# Patient Record
Sex: Male | Born: 1985 | Race: White | Hispanic: No | Marital: Married | Smoking: Never smoker
Health system: Southern US, Community
[De-identification: ages and names within clinical notes are randomized; demographics above are authoritative.]

## PROBLEM LIST (undated history)

## (undated) HISTORY — PX: ANTERIOR CRUCIATE LIGAMENT REPAIR: SHX115

---

## 2019-08-08 ENCOUNTER — Other Ambulatory Visit: Payer: Self-pay

## 2019-08-08 DIAGNOSIS — Z20822 Contact with and (suspected) exposure to covid-19: Secondary | ICD-10-CM

## 2019-08-09 LAB — NOVEL CORONAVIRUS, NAA: SARS-CoV-2, NAA: NOT DETECTED

## 2020-05-13 ENCOUNTER — Other Ambulatory Visit: Payer: Self-pay

## 2020-05-13 ENCOUNTER — Encounter (HOSPITAL_COMMUNITY): Payer: Self-pay | Admitting: Emergency Medicine

## 2020-05-13 ENCOUNTER — Emergency Department (HOSPITAL_COMMUNITY): Payer: BC Managed Care – PPO

## 2020-05-13 DIAGNOSIS — R109 Unspecified abdominal pain: Secondary | ICD-10-CM | POA: Diagnosis present

## 2020-05-13 DIAGNOSIS — R319 Hematuria, unspecified: Secondary | ICD-10-CM | POA: Insufficient documentation

## 2020-05-13 DIAGNOSIS — M549 Dorsalgia, unspecified: Secondary | ICD-10-CM | POA: Diagnosis not present

## 2020-05-13 DIAGNOSIS — N201 Calculus of ureter: Secondary | ICD-10-CM | POA: Diagnosis not present

## 2020-05-13 LAB — CBC
HCT: 43.4 % (ref 39.0–52.0)
Hemoglobin: 14.2 g/dL (ref 13.0–17.0)
MCH: 28.9 pg (ref 26.0–34.0)
MCHC: 32.7 g/dL (ref 30.0–36.0)
MCV: 88.2 fL (ref 80.0–100.0)
Platelets: 300 10*3/uL (ref 150–400)
RBC: 4.92 MIL/uL (ref 4.22–5.81)
RDW: 11.9 % (ref 11.5–15.5)
WBC: 11.1 10*3/uL — ABNORMAL HIGH (ref 4.0–10.5)
nRBC: 0 % (ref 0.0–0.2)

## 2020-05-13 LAB — COMPREHENSIVE METABOLIC PANEL
ALT: 21 U/L (ref 0–44)
AST: 23 U/L (ref 15–41)
Albumin: 4.7 g/dL (ref 3.5–5.0)
Alkaline Phosphatase: 58 U/L (ref 38–126)
Anion gap: 14 (ref 5–15)
BUN: 17 mg/dL (ref 6–20)
CO2: 25 mmol/L (ref 22–32)
Calcium: 9.9 mg/dL (ref 8.9–10.3)
Chloride: 103 mmol/L (ref 98–111)
Creatinine, Ser: 1.27 mg/dL — ABNORMAL HIGH (ref 0.61–1.24)
GFR calc Af Amer: >60 mL/min (ref >60)
GFR calc non Af Amer: >60 mL/min (ref >60)
Glucose, Bld: 132 mg/dL — ABNORMAL HIGH (ref 70–99)
Potassium: 3.8 mmol/L (ref 3.5–5.1)
Sodium: 142 mmol/L (ref 135–145)
Total Bilirubin: 0.4 mg/dL (ref 0.3–1.2)
Total Protein: 8.1 g/dL (ref 6.5–8.1)

## 2020-05-13 LAB — LIPASE, BLOOD: Lipase: 40 U/L (ref 11–51)

## 2020-05-13 MED ORDER — ONDANSETRON 4 MG PO TBDP: 4.0000 mg | ORAL_TABLET | Freq: Once | ORAL | Status: DC | PRN

## 2020-05-13 NOTE — ED Triage Notes (Signed)
Patient complaining of left back pain radiating to left lower abdominal. Patient states the pain is making him nauseas. This started 2030 tonight.

## 2020-05-14 ENCOUNTER — Emergency Department (HOSPITAL_COMMUNITY)
Admission: EM | Admit: 2020-05-14 | Discharge: 2020-05-14 | Disposition: A | Discharge status: Home / Self Care | Payer: BC Managed Care – PPO | Attending: Emergency Medicine | Admitting: Emergency Medicine

## 2020-05-14 DIAGNOSIS — N201 Calculus of ureter: Secondary | ICD-10-CM

## 2020-05-14 DIAGNOSIS — R319 Hematuria, unspecified: Secondary | ICD-10-CM

## 2020-05-14 LAB — URINALYSIS, ROUTINE W REFLEX MICROSCOPIC
Bacteria, UA: NONE SEEN
Bilirubin Urine: NEGATIVE
Glucose, UA: NEGATIVE mg/dL
Ketones, ur: NEGATIVE mg/dL
Leukocytes,Ua: NEGATIVE
Nitrite: NEGATIVE
Protein, ur: NEGATIVE mg/dL
Specific Gravity, Urine: 1.025 (ref 1.005–1.030)
pH: 5 (ref 5.0–8.0)

## 2020-05-14 MED ORDER — OXYCODONE-ACETAMINOPHEN 5-325 MG PO TABS
1.0000 | ORAL_TABLET | Freq: Once | ORAL | Status: AC
  Administered 2020-05-14: 1 via ORAL
  Filled 2020-05-14: qty 1

## 2020-05-14 MED ORDER — OXYCODONE-ACETAMINOPHEN 5-325 MG PO TABS: 1.0000 | ORAL_TABLET | ORAL | 0 refills | Status: DC | PRN

## 2020-05-14 MED ORDER — ONDANSETRON 4 MG PO TBDP
4.0000 mg | ORAL_TABLET | Freq: Once | ORAL | Status: AC
  Administered 2020-05-14: 4 mg via ORAL
  Filled 2020-05-14: qty 1

## 2020-05-14 MED ORDER — KETOROLAC TROMETHAMINE 60 MG/2ML IM SOLN
30.0000 mg | Freq: Once | INTRAMUSCULAR | Status: AC
  Administered 2020-05-14: 30 mg via INTRAMUSCULAR
  Filled 2020-05-14: qty 2

## 2020-05-14 MED ORDER — TAMSULOSIN HCL 0.4 MG PO CAPS
0.4000 mg | ORAL_CAPSULE | Freq: Every day | ORAL | 0 refills | Status: DC
Start: 2020-05-14 — End: 2022-01-03

## 2020-05-14 MED ORDER — ONDANSETRON 4 MG PO TBDP
4.0000 mg | ORAL_TABLET | Freq: Three times a day (TID) | ORAL | 0 refills | Status: DC | PRN
Start: 2020-05-14 — End: 2022-01-03

## 2020-05-14 NOTE — Discharge Instructions (Signed)
You were found to have kidney stone on your left side today.  This is very close to your bladder and will likely pass in the next 24 to 48 hours. Take the prescribed medication as directed.  Do not drive while taking pain medication. Follow-up with urology--call their office for appointment. Return to the ED for new or worsening symptoms--high fever, inability to urinate, uncontrolled vomiting, etc.

## 2020-05-14 NOTE — ED Provider Notes (Signed)
[facility]-EMERGENCY DEPT Provider Note   CSN: [id number] Arrival date & time: 05/13/20  2054     History Chief Complaint  Patient presents with  . Flank Pain  . Back Pain    Colin Holmes is a 34 y.o. male.  The history is provided by medical records and the patient.  Flank Pain  Back Pain   33 y.o. M presenting to the ED with sudden onset left flank pain around 8:30PM.  States pain is sharp/stabbing and coming in waves, now more of a dull ache.  Pain does improve when he puts his fist behind his back and applies pressure.  Did have some associated nausea and vomiting initially when pain was most intense, that seems to have improved.  No fever or chills.  No difficulty urinating or noted hematuria.  No history of kidney stones.  No meds prior to arrival.  History reviewed. No pertinent past medical history.  There are no problems to display for this patient.   History reviewed. No pertinent surgical history.     History reviewed. No pertinent family history.  Social History   Tobacco Use  . Smoking status: Never Smoker  . Smokeless tobacco: Never Used  Vaping Use  . Vaping Use: Never used  Substance Use Topics  . Alcohol use: Yes  . Drug use: Yes    Types: Marijuana    Home Medications Prior to Admission medications   Not on File    Allergies    Patient has no known allergies.  Review of Systems   Review of Systems  Genitourinary: Positive for flank pain.  Musculoskeletal: Positive for back pain.  All other systems reviewed and are negative.   Physical Exam Updated Vital Signs BP 138/71   Pulse 80   Temp 97.8 F (36.6 C) (Oral)   Resp 18   Ht 5\' 8"  (1.727 m)   Wt 93 kg   SpO2 98%   BMI 31.17 kg/m   Physical Exam Vitals and nursing note reviewed.  Constitutional:      Appearance: He is well-developed.  HENT:     Head: Normocephalic and atraumatic.  Eyes:     Conjunctiva/sclera: Conjunctivae normal.     Pupils:  Pupils are equal, round, and reactive to light.  Cardiovascular:     Rate and Rhythm: Normal rate and regular rhythm.     Heart sounds: Normal heart sounds.  Pulmonary:     Effort: Pulmonary effort is normal.     Breath sounds: Normal breath sounds.  Abdominal:     General: Bowel sounds are normal.     Palpations: Abdomen is soft.  Musculoskeletal:        General: Normal range of motion.     Cervical back: Normal range of motion.  Skin:    General: Skin is warm and dry.  Neurological:     Mental Status: He is alert and oriented to person, place, and time.     ED Results / Procedures / Treatments   Labs (all labs ordered are listed, but only abnormal results are displayed) Labs Reviewed  COMPREHENSIVE METABOLIC PANEL - Abnormal; Notable for the following components:      Result Value   Glucose, Bld 132 (*)    Creatinine, Ser 1.27 (*)    All other components within normal limits  CBC - Abnormal; Notable for the following components:   WBC 11.1 (*)    All other components within normal limits  URINALYSIS, ROUTINE W REFLEX  MICROSCOPIC - Abnormal; Notable for the following components:   Hgb urine dipstick SMALL (*)    All other components within normal limits  LIPASE, BLOOD    EKG None  Radiology CT Renal Stone Study  Result Date: 05/13/2020 CLINICAL DATA:  Flank pain EXAM: CT ABDOMEN AND PELVIS WITHOUT CONTRAST TECHNIQUE: Multidetector CT imaging of the abdomen and pelvis was performed following the standard protocol without IV contrast. COMPARISON:  None. FINDINGS: Lower chest: No acute consolidation or pleural effusion. Normal cardiac size. Hepatobiliary: No focal liver abnormality is seen. No gallstones, gallbladder wall thickening, or biliary dilatation. Pancreas: Unremarkable. No pancreatic ductal dilatation or surrounding inflammatory changes. Spleen: Normal in size without focal abnormality. Adrenals/Urinary Tract: Adrenal glands are normal. Punctate stone in the mid  right kidney. Multiple punctate stones within the left kidney. Mild left hydronephrosis and hydroureter, secondary to a 5 mm stone in the distal ureter just proximal to the left UVJ. The bladder is unremarkable. Stomach/Bowel: Stomach is within normal limits. Appendix appears normal. No evidence of bowel wall thickening, distention, or inflammatory changes. Vascular/Lymphatic: No significant vascular findings are present. No enlarged abdominal or pelvic lymph nodes. Reproductive: Prostate calcification without mass Other: Negative for free air or free fluid. Tiny fat in the umbilical region. Musculoskeletal: No acute or significant osseous findings. IMPRESSION: 1. Mild left hydronephrosis and hydroureter, secondary to a 5 mm stone in the distal left ureter just proximal to the left UVJ. 2. Multiple intrarenal stones bilaterally. Electronically Signed   By: Jasmine Pang M.D.   On: 05/13/2020 22:23    Procedures Procedures (including critical care time)  Medications Ordered in ED Medications  ondansetron (ZOFRAN-ODT) disintegrating tablet 4 mg (has no administration in time range)  ketorolac (TORADOL) injection 30 mg (30 mg Intramuscular Given 05/14/20 0510)  oxyCODONE-acetaminophen (PERCOCET/ROXICET) 5-325 MG per tablet 1 tablet (1 tablet Oral Given 05/14/20 0509)  ondansetron (ZOFRAN-ODT) disintegrating tablet 4 mg (4 mg Oral Given 05/14/20 1607)    ED Course  I have reviewed the triage vital signs and the nursing notes.  Pertinent labs & imaging results that were available during my care of the patient were reviewed by me and considered in my medical decision making (see chart for details).    MDM Rules/Calculators/A&P  34 year old male presenting to the ED with sudden onset of left-sided flank pain around 8:30 PM.  Does report some nausea and vomiting.  Pain has since become more "dull".  He is afebrile and nontoxic.  Labs are overall reassuring.  UA with some blood but no signs of infection.   Renal stone study was obtained revealing left 5 mm distal ureteral stone.  Patient provided with analgesia here, plan to discharge home with symptomatic care.  Given location of stone, likely to pass in the next few days.  We will have him follow-up with urology.  He may return here for any new or acute changes.  Final Clinical Impression(s) / ED Diagnoses Final diagnoses:  Left ureteral stone  Hematuria, unspecified type    Rx / DC Orders ED Discharge Orders         Ordered    oxyCODONE-acetaminophen (PERCOCET) 5-325 MG tablet  Every 4 hours PRN     Discontinue  Reprint     05/14/20 0603    tamsulosin (FLOMAX) 0.4 MG CAPS capsule  Daily after supper     Discontinue  Reprint     05/14/20 0603    ondansetron (ZOFRAN ODT) 4 MG disintegrating tablet  Every 8 hours  PRN     Discontinue  Reprint     05/14/20 0603           Garlon Hatchet, PA-C 05/14/20 0616    Molpus, Jonny Ruiz, MD 05/14/20 [phone]

## 2020-05-14 NOTE — ED Notes (Signed)
Declined dc vitals.

## 2020-05-18 ENCOUNTER — Other Ambulatory Visit: Payer: Self-pay | Admitting: Urology

## 2020-05-18 ENCOUNTER — Other Ambulatory Visit (HOSPITAL_COMMUNITY)
Admission: RE | Admit: 2020-05-18 | Discharge: 2020-05-18 | Disposition: A | Discharge status: Home / Self Care | Payer: BC Managed Care – PPO | Source: Ambulatory Visit | Attending: Urology | Admitting: Urology

## 2020-05-18 DIAGNOSIS — Z01812 Encounter for preprocedural laboratory examination: Secondary | ICD-10-CM | POA: Insufficient documentation

## 2020-05-18 DIAGNOSIS — Z20822 Contact with and (suspected) exposure to covid-19: Secondary | ICD-10-CM | POA: Diagnosis not present

## 2020-05-18 LAB — SARS CORONAVIRUS 2 (TAT 6-24 HRS): SARS Coronavirus 2: NEGATIVE

## 2020-05-18 NOTE — Progress Notes (Signed)
Talked with patient. Instructions given. Arrival time 1030. NPO after MN. Mother in law is the driver

## 2020-05-21 ENCOUNTER — Ambulatory Visit (HOSPITAL_COMMUNITY): Payer: BC Managed Care – PPO

## 2020-05-21 ENCOUNTER — Encounter (HOSPITAL_BASED_OUTPATIENT_CLINIC_OR_DEPARTMENT_OTHER)
Admission: RE | Disposition: A | Discharge status: Home / Self Care | Payer: Self-pay | Source: Home / Self Care | Attending: Urology

## 2020-05-21 ENCOUNTER — Encounter (HOSPITAL_BASED_OUTPATIENT_CLINIC_OR_DEPARTMENT_OTHER): Payer: Self-pay | Admitting: Urology

## 2020-05-21 ENCOUNTER — Other Ambulatory Visit: Payer: Self-pay

## 2020-05-21 ENCOUNTER — Ambulatory Visit (HOSPITAL_BASED_OUTPATIENT_CLINIC_OR_DEPARTMENT_OTHER)
Admission: RE | Admit: 2020-05-21 | Discharge: 2020-05-21 | Disposition: A | Discharge status: Home / Self Care | Payer: BC Managed Care – PPO | Attending: Urology | Admitting: Urology

## 2020-05-21 DIAGNOSIS — N201 Calculus of ureter: Secondary | ICD-10-CM

## 2020-05-21 HISTORY — PX: EXTRACORPOREAL SHOCK WAVE LITHOTRIPSY: SHX1557

## 2020-05-21 SURGERY — LITHOTRIPSY, ESWL
Anesthesia: LOCAL | Laterality: Left

## 2020-05-21 MED ORDER — CIPROFLOXACIN HCL 500 MG PO TABS
500.0000 mg | ORAL_TABLET | ORAL | Status: AC
  Administered 2020-05-21: 500 mg via ORAL

## 2020-05-21 MED ORDER — CIPROFLOXACIN HCL 500 MG PO TABS
ORAL_TABLET | ORAL | Status: AC
  Filled 2020-05-21: qty 1

## 2020-05-21 MED ORDER — DIPHENHYDRAMINE HCL 25 MG PO CAPS
25.0000 mg | ORAL_CAPSULE | ORAL | Status: AC
  Administered 2020-05-21: 25 mg via ORAL

## 2020-05-21 MED ORDER — DIAZEPAM 5 MG PO TABS
ORAL_TABLET | ORAL | Status: AC
  Filled 2020-05-21: qty 2

## 2020-05-21 MED ORDER — DIAZEPAM 5 MG PO TABS
10.0000 mg | ORAL_TABLET | ORAL | Status: AC
  Administered 2020-05-21: 10 mg via ORAL

## 2020-05-21 MED ORDER — SODIUM CHLORIDE 0.9 % IV SOLN: INTRAVENOUS | Status: DC

## 2020-05-21 MED ORDER — OXYCODONE HCL 5 MG PO TABS: 5.0000 mg | ORAL_TABLET | Freq: Three times a day (TID) | ORAL | 0 refills | Status: AC | PRN

## 2020-05-21 MED ORDER — OXYCODONE-ACETAMINOPHEN 5-325 MG PO TABS: 1.0000 | ORAL_TABLET | ORAL | 0 refills | Status: DC | PRN

## 2020-05-21 MED ORDER — DIPHENHYDRAMINE HCL 25 MG PO CAPS
ORAL_CAPSULE | ORAL | Status: AC
  Filled 2020-05-21: qty 1

## 2020-05-21 NOTE — Discharge Instructions (Signed)
See [facility] discharge instructions in chart.  

## 2020-05-21 NOTE — Op Note (Signed)
See [facility] OP note scanned into chart. 

## 2020-05-21 NOTE — Interval H&P Note (Signed)
History and Physical Interval Note:  05/21/2020 1:06 PM  Colin Holmes  has presented today for surgery, with the diagnosis of LEFT URETERAL STONE.  The various methods of treatment have been discussed with the patient and family. After consideration of risks, benefits and other options for treatment, the patient has consented to  Procedure(s): LEFT EXTRACORPOREAL SHOCK WAVE LITHOTRIPSY (ESWL) (Left) as a surgical intervention.  The patient's history has been reviewed, patient examined, no change in status, stable for surgery.  I have reviewed the patient's chart and labs.  Questions were answered to the patient's satisfaction.     Bertram Millard Shilynn Hoch

## 2020-05-21 NOTE — H&P (Signed)
H&P  Chief Complaint: Kidney stone  History of Present Illness: 5 mm left distal ureteral stone--symptomatic. Here for ESL.  History reviewed. No pertinent past medical history.  Past Surgical History:  Procedure Laterality Date  . ANTERIOR CRUCIATE LIGAMENT REPAIR      Home Medications:    Allergies: No Known Allergies  History reviewed. No pertinent family history.  Social History:  reports that he has never smoked. He has never used smokeless tobacco. He reports current alcohol use. He reports current drug use. Drug: Marijuana.  ROS: A complete review of systems was performed.  All systems are negative except for pertinent findings as noted.  Physical Exam:  Vital signs in last 24 hours: BP 124/66   Pulse 72   Temp 98.4 F (36.9 C) (Oral)   Resp 16   Ht 5\' 8"  (1.727 m)   Wt 83 kg   SpO2 98%   BMI 27.82 kg/m  Constitutional:  Alert and oriented, No acute distress Cardiovascular: Regular rate  Respiratory: Normal respiratory effort GI: Abdomen is soft, nontender, nondistended, no abdominal masses. No CVAT.  Genitourinary: Normal male phallus, testes are descended bilaterally and non-tender and without masses, scrotum is normal in appearance without lesions or masses, perineum is normal on inspection. Lymphatic: No lymphadenopathy Neurologic: Grossly intact, no focal deficits Psychiatric: Normal mood and affect  Laboratory Data:  No results for input(s): WBC, HGB, HCT, PLT in the last 72 hours.  No results for input(s): NA, K, CL, GLUCOSE, BUN, CALCIUM, CREATININE in the last 72 hours.  Invalid input(s): CO3   No results found for this or any previous visit (from the past 24 hour(s)). Recent Results (from the past 240 hour(s))  SARS CORONAVIRUS 2 (TAT 6-24 HRS) Nasopharyngeal Nasopharyngeal Swab     Status: None   Collection Time: 05/18/20 11:57 AM   Specimen: Nasopharyngeal Swab  Result Value Ref Range Status   SARS Coronavirus 2 NEGATIVE NEGATIVE Final     Comment: (NOTE) SARS-CoV-2 target nucleic acids are NOT DETECTED.  The SARS-CoV-2 RNA is generally detectable in upper and lower respiratory specimens during the acute phase of infection. Negative results do not preclude SARS-CoV-2 infection, do not rule out co-infections with other pathogens, and should not be used as the sole basis for treatment or other patient management decisions. Negative results must be combined with clinical observations, patient history, and epidemiological information. The expected result is Negative.  Fact Sheet for Patients: 05/20/20  Fact Sheet for Healthcare Providers: HairSlick.no  This test is not yet approved or cleared by the [employer]ier[employer]ir[employer] FDA and  has been authorized for detection and/or diagnosis of SARS-CoV-2 by FDA under an Emergency Use Authorization (EUA). This EUA will remain  in effect (meaning this test can be used) for the duration of the COVID-19 declaration under Se ction 564(b)(1) of the Act, 21 U.S.C. section 360bbb-3(b)(1), unless the authorization is terminated or revoked sooner.  Performed at [facility], [address], [place], [place]     Renal Function: No results for input(s): CREATININE in the last 168 hours. Estimated Creatinine Clearance: 87.6 mL/min (A) (by C-G formula based on SCr of 1.27 mg/dL (H)).   Assessment:Left distal ureteral stone  Plan:  Left ESL--part of a staged procedure

## 2020-05-22 ENCOUNTER — Encounter (HOSPITAL_BASED_OUTPATIENT_CLINIC_OR_DEPARTMENT_OTHER): Payer: Self-pay | Admitting: Urology

## 2021-03-27 IMAGING — CT CT RENAL STONE PROTOCOL
2 of 5 series · 16 of 46 positions shown, 18 images · non-contrast
Comparison: None.

CLINICAL DATA: Flank pain

EXAM:
CT ABDOMEN AND PELVIS WITHOUT CONTRAST
TECHNIQUE: Multidetector CT imaging of the abdomen and pelvis was performed
following the standard protocol without IV contrast.

[Series 3: axial st · axial · 0.79mm/px · z∈[-541,-111]mm · 13 of 98 slices shown, 15 images]
[im 6/98  soft-tissue]
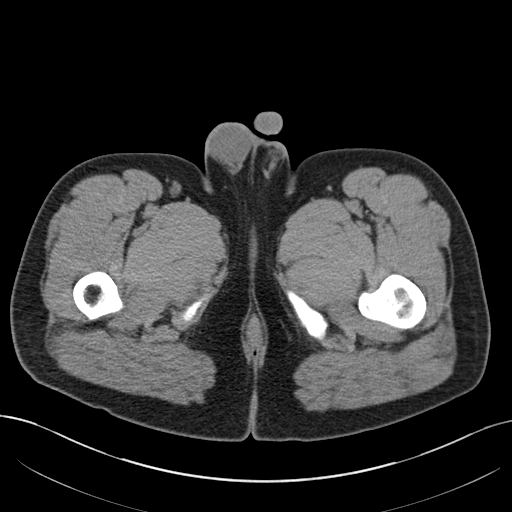
[im 6/98  bone]
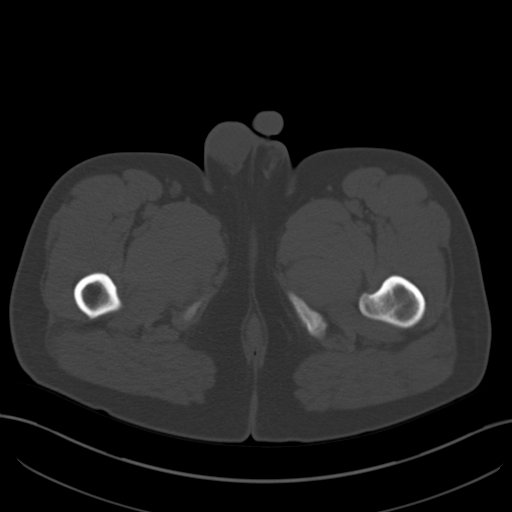
[im 16/98  soft-tissue]
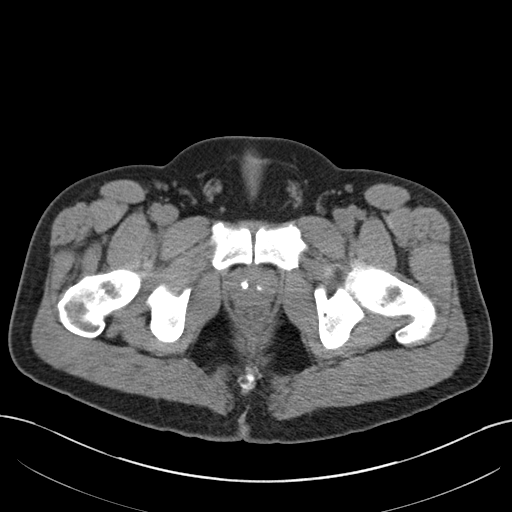
[im 21/98  soft-tissue]
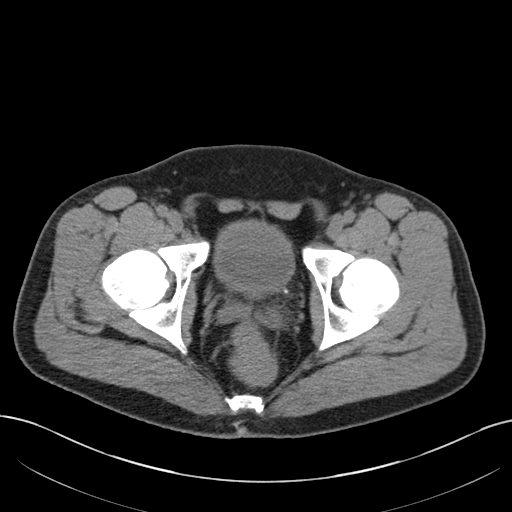
[im 26/98  soft-tissue]
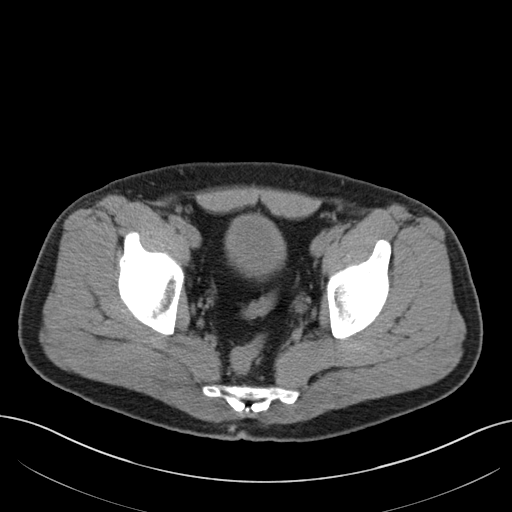
[im 36/98  soft-tissue]
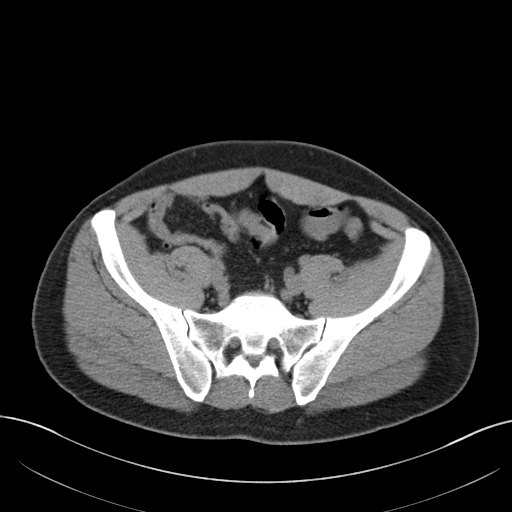
[im 41/98  soft-tissue]
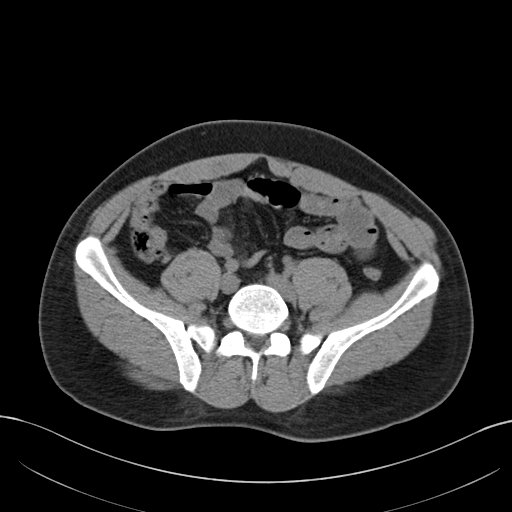
[im 52/98  soft-tissue]
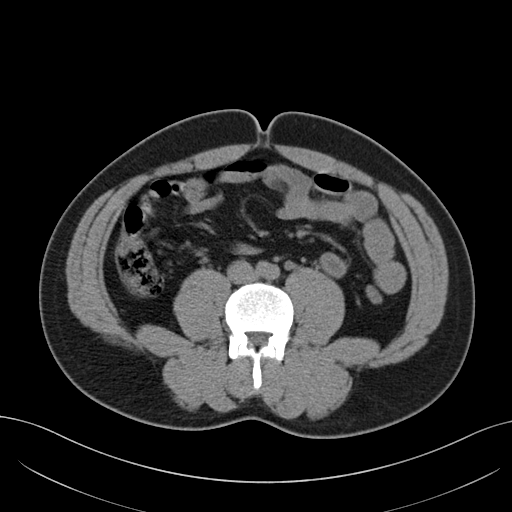
[im 57/98  soft-tissue]
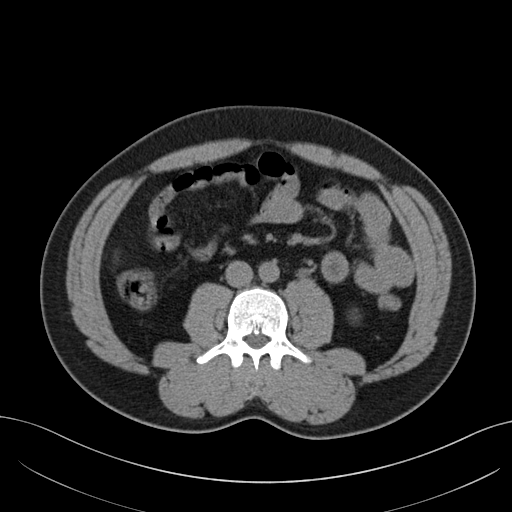
[im 62/98  soft-tissue]
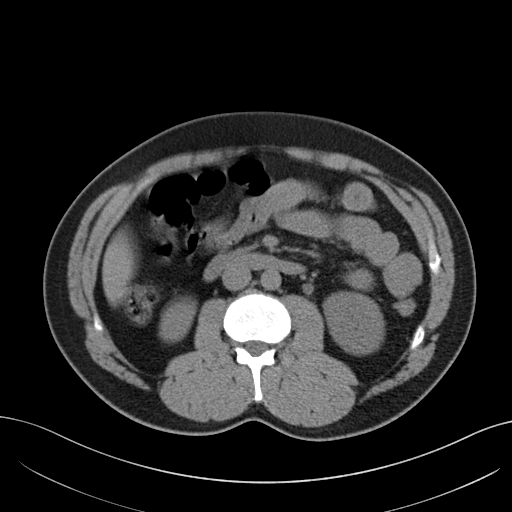
[im 62/98  bone]
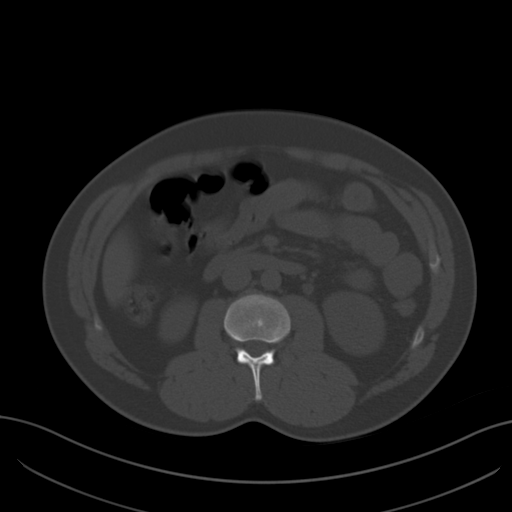
[im 72/98  soft-tissue]
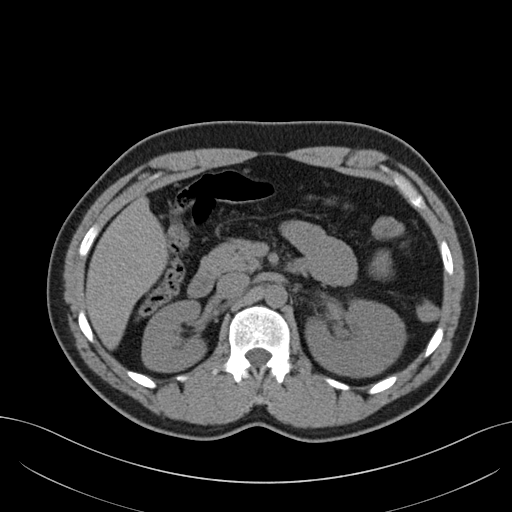
[im 77/98  soft-tissue]
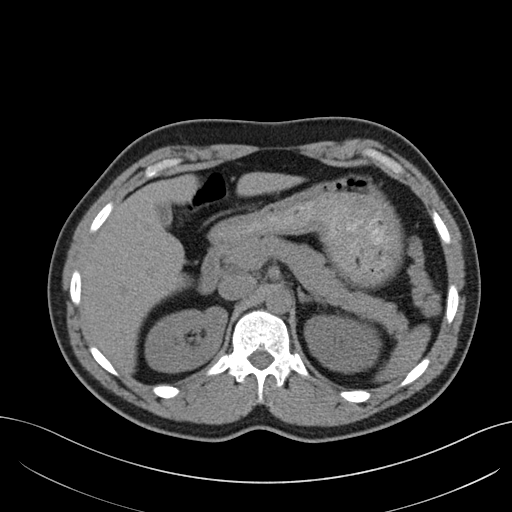
[im 82/98  soft-tissue]
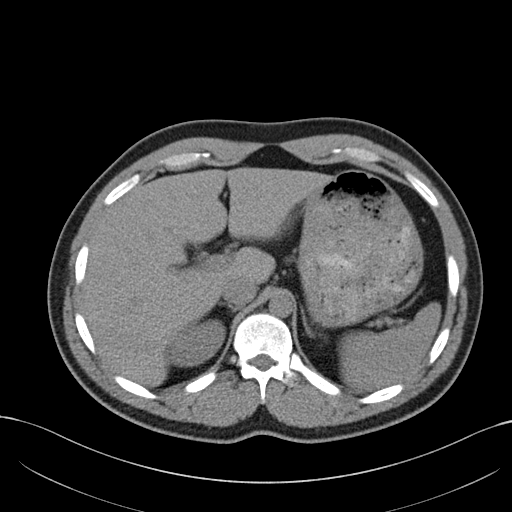
[im 92/98  soft-tissue]
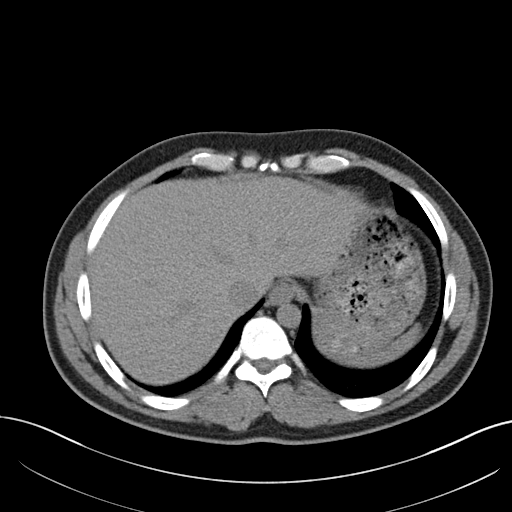

[Series 6: coronal · coronal · 0.74mm/px · 3 of 140 slices shown]
[im 47/140  soft-tissue]
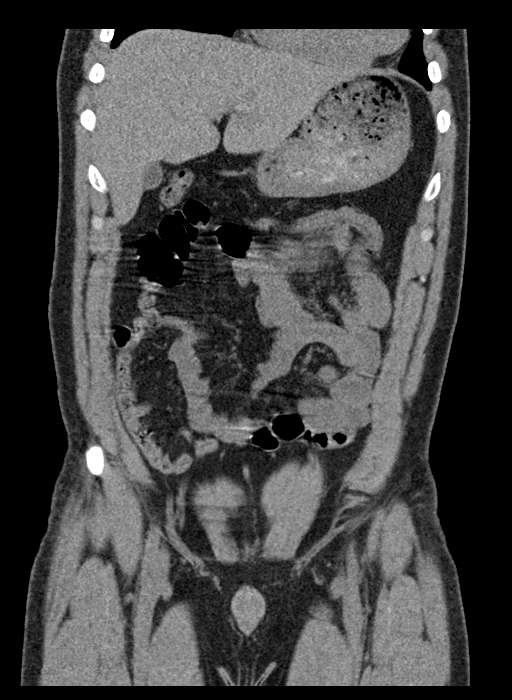
[im 62/140  soft-tissue]
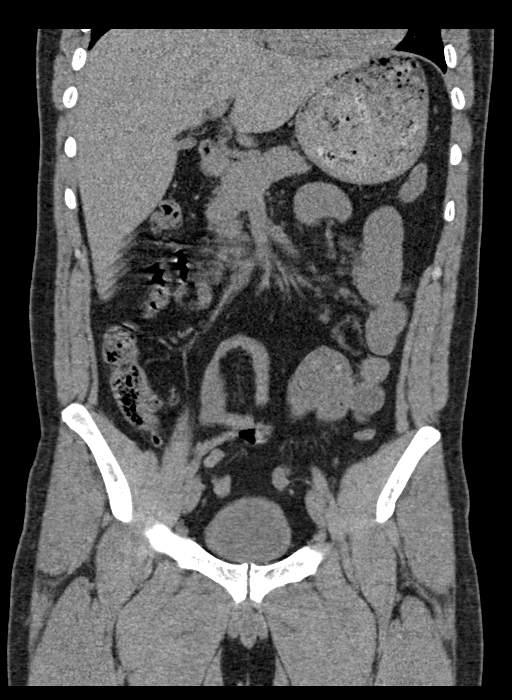
[im 78/140  soft-tissue]
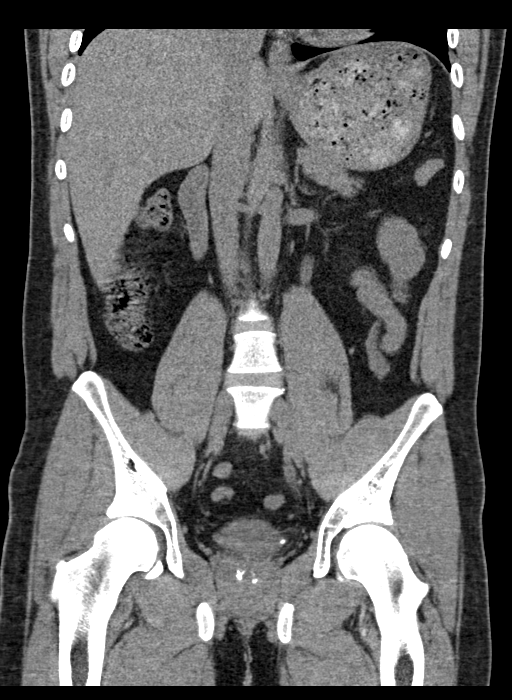

[16 of 46 positions shown; findings below may reference images not displayed]

FINDINGS: Lower chest: No acute consolidation or pleural effusion. Normal
cardiac size.

Hepatobiliary: No focal liver abnormality is seen. No gallstones,
gallbladder wall thickening, or biliary dilatation.

Pancreas: Unremarkable. No pancreatic ductal dilatation or
surrounding inflammatory changes.

Spleen: Normal in size without focal abnormality.

Adrenals/Urinary Tract: Adrenal glands are normal. Punctate stone in
the mid right kidney. Multiple punctate stones within the left
kidney. Mild left hydronephrosis and hydroureter, secondary to a 5
mm stone in the distal ureter just proximal to the left UVJ. The
bladder is unremarkable.

Stomach/Bowel: Stomach is within normal limits. Appendix appears
normal. No evidence of bowel wall thickening, distention, or
inflammatory changes.

Vascular/Lymphatic: No significant vascular findings are present. No
enlarged abdominal or pelvic lymph nodes.

Reproductive: Prostate calcification without mass

Other: Negative for free air or free fluid. Tiny fat in the
umbilical region.

Musculoskeletal: No acute or significant osseous findings.
IMPRESSION: 1. Mild left hydronephrosis and hydroureter, secondary to a 5 mm
stone in the distal left ureter just proximal to the left UVJ.
2. Multiple intrarenal stones bilaterally.

## 2022-01-03 ENCOUNTER — Ambulatory Visit
Admission: EM | Admit: 2022-01-03 | Discharge: 2022-01-03 | Disposition: A | Discharge status: Home / Self Care | Payer: BC Managed Care – PPO | Attending: Emergency Medicine | Admitting: Emergency Medicine

## 2022-01-03 DIAGNOSIS — L03116 Cellulitis of left lower limb: Secondary | ICD-10-CM

## 2022-01-03 MED ORDER — SULFAMETHOXAZOLE-TRIMETHOPRIM 800-160 MG PO TABS: 1.0000 | ORAL_TABLET | Freq: Two times a day (BID) | ORAL | 0 refills | Status: AC

## 2022-01-03 NOTE — ED Triage Notes (Signed)
Pt c/o poison ivy exposure 2 weeks ago. ?

## 2022-01-03 NOTE — ED Provider Notes (Signed)
?[facility] ? ? ? ?CSN: [id number] ?Arrival date & time: 01/03/22  1345 ?  ? ?HISTORY  ? ?Chief Complaint  ?Patient presents with  ? Poison Ivy  ? ?HPI ?Colin Holmes is a 36 y.o. male. Patient reports being exposed to poison ivy 2 weeks ago and now having a rash.  States he has been managing the rash with calamine lotion but one of the areas of exposure on the back of his left lower leg he has been keeping covered with a gauze bandage to "pull the oil out" of his skin.  Patient states he noticed today that his left lower leg is now swollen, and red below that same area.  Patient states he is also noticed yellow drainage from the wound on the gauze pad.  Patient denies foul odor of the wound. ? ?The history is provided by the patient.  ?History reviewed. No pertinent past medical history. ?There are no problems to display for this patient. ? ?Past Surgical History:  ?Procedure Laterality Date  ? ANTERIOR CRUCIATE LIGAMENT REPAIR    ? EXTRACORPOREAL SHOCK WAVE LITHOTRIPSY Left 05/21/2020  ? Procedure: LEFT EXTRACORPOREAL SHOCK WAVE LITHOTRIPSY (ESWL);  Surgeon: Marcine Matar, MD;  Location: [facility];  Service: Urology;  Laterality: Left;  ? ? ?Home Medications   ? ?Prior to Admission medications   ?Not on File  ? ? ?Family History ?History reviewed. No pertinent family history. ?Social History ?Social History  ? ?Tobacco Use  ? Smoking status: Never  ? Smokeless tobacco: Never  ?Vaping Use  ? Vaping Use: Never used  ?Substance Use Topics  ? Alcohol use: Yes  ? Drug use: Yes  ?  Types: Marijuana  ? ?Allergies   ?Patient has no known allergies. ? ?Review of Systems ?Review of Systems ?Pertinent findings noted in history of present illness.  ? ?Physical Exam ?Triage Vital Signs ?ED Triage Vitals  ?Enc Vitals Group  ?   BP 07/26/21 0827 (!) 147/82  ?   Pulse Rate 07/26/21 0827 72  ?   Resp 07/26/21 0827 18  ?   Temp 07/26/21 0827 98.3 ?F (36.8 ?C)  ?   Temp Source 07/26/21 0827 Oral  ?    SpO2 07/26/21 0827 98 %  ?   Weight --   ?   Height --   ?   Head Circumference --   ?   Peak Flow --   ?   Pain Score 07/26/21 0826 5  ?   Pain Loc --   ?   Pain Edu? --   ?   Excl. in GC? --   ?No data found. ? ?Updated Vital Signs ?BP 131/71 (BP Location: Right Arm)   Pulse 60   Temp 97.8 ?F (36.6 ?C) (Oral)   Resp 18   SpO2 95%  ? ?Physical Exam ?Vitals and nursing note reviewed.  ?Constitutional:   ?   General: He is not in acute distress. ?   Appearance: Normal appearance. He is not ill-appearing.  ?HENT:  ?   Head: Normocephalic and atraumatic.  ?Eyes:  ?   General: Lids are normal.     ?   Right eye: No discharge.     ?   Left eye: No discharge.  ?   Extraocular Movements: Extraocular movements intact.  ?   Conjunctiva/sclera: Conjunctivae normal.  ?   Right eye: Right conjunctiva is not injected.  ?   Left eye: Left conjunctiva is not injected.  ?Neck:  ?  Trachea: Trachea and phonation normal.  ?Cardiovascular:  ?   Rate and Rhythm: Normal rate and regular rhythm.  ?   Pulses: Normal pulses.  ?   Heart sounds: Normal heart sounds. No murmur heard. ?  No friction rub. No gallop.  ?Pulmonary:  ?   Effort: Pulmonary effort is normal. No accessory muscle usage, prolonged expiration or respiratory distress.  ?   Breath sounds: Normal breath sounds. No stridor, decreased air movement or transmitted upper airway sounds. No decreased breath sounds, wheezing, rhonchi or rales.  ?Chest:  ?   Chest wall: No tenderness.  ?Musculoskeletal:     ?   General: Normal range of motion.  ?   Cervical back: Normal range of motion and neck supple. Normal range of motion.  ?   Comments: TNTC areas of atopy on both lower extremities.  Lesion on the posterior aspect of his left lower leg is using honey colored drainage.  Lesion is surrounded with erythema and warmth, left lower extremity is also enlarged compared to right lower extremity.  ?Lymphadenopathy:  ?   Cervical: No cervical adenopathy.  ?Skin: ?   General: Skin is  warm and dry.  ?   Findings: No erythema or rash.  ?Neurological:  ?   General: No focal deficit present.  ?   Mental Status: He is alert and oriented to person, place, and time.  ?Psychiatric:     ?   Mood and Affect: Mood normal.     ?   Behavior: Behavior normal.  ? ? ?Visual Acuity ?Right Eye Distance:   ?Left Eye Distance:   ?Bilateral Distance:   ? ?Right Eye Near:   ?Left Eye Near:    ?Bilateral Near:    ? ?UC Couse / Diagnostics / Procedures:  ?  ?EKG ? ?Radiology ?No results found. ? ?Procedures ?Procedures (including critical care time) ? ?UC Diagnoses / Final Clinical Impressions(s)   ?I have reviewed the triage vital signs and the nursing notes. ? ?Pertinent labs & imaging results that were available during my care of the patient were reviewed by me and considered in my medical decision making (see chart for details).   ? ?Final diagnoses:  ?Cellulitis of left lower leg  ? ?Patient advised that I believe that he has acquired a superficial bacterial infection secondary to the initial onslaught of poison ivy.  Patient prescribed Bactrim twice daily for 7 days.  Patient advised to follow-up if area is not getting better or appears to be getting worse in the next 3 to 4 days of antibiotic treatment.  Patient advised to take all antibiotics as prescribed even if the wound is completely resolved before he finishes them.  Patient verbalized understanding and agreement with this plan.. ? ?ED Prescriptions   ? ? Medication Sig Dispense Auth. Provider  ? sulfamethoxazole-trimethoprim (BACTRIM DS) 800-160 MG tablet Take 1 tablet by mouth 2 (two) times daily for 7 days. 14 tablet Theadora RamaMorgan, Leonard Feigel Scales, PA-C  ? ?  ? ?PDMP not reviewed this encounter. ? ?Pending results:  ?Labs Reviewed - No data to display ? ?Medications Ordered in UC: ?Medications - No data to display ? ?Disposition Upon Discharge:  ?Condition: stable for discharge home ?Home: take medications as prescribed; routine discharge instructions as  discussed; follow up as advised. ? ?Patient presented with an acute illness with associated systemic symptoms and significant discomfort requiring urgent management. In my opinion, this is a condition that a prudent lay person (someone who possesses an average  knowledge of health and medicine) may potentially expect to result in complications if not addressed urgently such as respiratory distress, impairment of bodily function or dysfunction of bodily organs.  ? ?Routine symptom specific, illness specific and/or disease specific instructions were discussed with the patient and/or caregiver at length.  ? ?As such, the patient has been evaluated and assessed, work-up was performed and treatment was provided in alignment with urgent care protocols and evidence based medicine.  Patient/parent/caregiver has been advised that the patient may require follow up for further testing and treatment if the symptoms continue in spite of treatment, as clinically indicated and appropriate. ? ?If the patient was tested for COVID-19, Influenza and/or RSV, then the patient/parent/guardian was advised to isolate at home pending the results of his/her diagnostic coronavirus test and potentially longer if they?re positive. I have also advised pt that if his/her COVID-19 test returns positive, it's recommended to self-isolate for at least 10 days after symptoms first appeared AND until fever-free for 24 hours without fever reducer AND other symptoms have improved or resolved. Discussed self-isolation recommendations as well as instructions for household member/close contacts as per the [facility] and [place] [facility], and also gave patient the COVID packet with this information. ? ?Patient/parent/caregiver has been advised to return to the [facility] or PCP in 3-5 days if no better; to PCP or the Emergency Department if new signs and symptoms develop, or if the current signs or symptoms continue to change or worsen for further workup, evaluation and treatment  as clinically indicated and appropriate ? ?The patient will follow up with their current PCP if and as advised. If the patient does not currently have a PCP we will assist them in obtaining one.  ? ?The patient m

## 2022-01-03 NOTE — Discharge Instructions (Addendum)
To treat the secondary infection in your left lower leg, please begin Bactrim 1 tablet twice daily for the next 7 days.  Please finish all tablets as prescribed to avoid worsening infection. ? ?Please continue to use calamine lotion as needed for itching.  Please try to avoid scratching as this is the likely cause of the infection in your left lower leg. ? ?If the redness and yellow drainage does not improve after 3 to 4 days of antibiotics, please go to the emergency room or return here for repeat evaluation.  You may require an adjustment to your antibiotics. ? ?Thank you for visiting urgent care today. ?
